# Patient Record
Sex: Male | Born: 1960 | Race: Black or African American | Hispanic: No | Marital: Married | Smoking: Never smoker
Health system: Southern US, Community
[De-identification: ages and names within clinical notes are randomized; demographics above are authoritative.]

## PROBLEM LIST (undated history)

## (undated) DIAGNOSIS — J45909 Unspecified asthma, uncomplicated: Secondary | ICD-10-CM

## (undated) DIAGNOSIS — I7090 Unspecified atherosclerosis: Secondary | ICD-10-CM

## (undated) DIAGNOSIS — M1990 Unspecified osteoarthritis, unspecified site: Secondary | ICD-10-CM

## (undated) HISTORY — PX: CORONARY ANGIOPLASTY WITH STENT PLACEMENT: SHX49

---

## 1997-11-24 ENCOUNTER — Other Ambulatory Visit: Admission: RE | Admit: 1997-11-24 | Discharge: 1997-11-24 | Payer: Self-pay | Admitting: Internal Medicine

## 2001-10-24 ENCOUNTER — Emergency Department (HOSPITAL_COMMUNITY): Admission: EM | Admit: 2001-10-24 | Discharge: 2001-10-24 | Payer: Self-pay | Admitting: Emergency Medicine

## 2016-02-02 ENCOUNTER — Telehealth (HOSPITAL_COMMUNITY): Payer: Self-pay | Admitting: *Deleted

## 2016-02-02 NOTE — Telephone Encounter (Signed)
Received message from [facility] cardiac rehab program.  [place] [facility].  Pt was contacted and since pt lives in [place] pt preferred to attend at [facility].  [facility] had received referral from [facility] and preparations were being made to obtain the necessary signed forms from MD.  Jeanene Erballed and spoke to pt, pt desires a later class or weekend.  Advised pt that the last phase II class is 2:45 to 4:00.  Pt unable to attend due to his work schedule.  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 I called [facility] to see their class time.  Pt remarked his school he teaches at is 2 miles away from the hospital.  Called and obtained the last class is 11-12:30.  They do not have an early morning class.  Pt called and message left for pt.  Asked for call back - contact information provided. Alanson Alyarlette Carlton RN, BSN

## 2016-06-16 ENCOUNTER — Emergency Department (HOSPITAL_COMMUNITY): Payer: BC Managed Care – PPO

## 2016-06-16 ENCOUNTER — Emergency Department (HOSPITAL_COMMUNITY)
Admission: EM | Admit: 2016-06-16 | Discharge: 2016-06-16 | Discharge status: Against Medical Advice | Payer: BC Managed Care – PPO | Attending: Emergency Medicine | Admitting: Emergency Medicine

## 2016-06-16 ENCOUNTER — Encounter (HOSPITAL_COMMUNITY): Payer: Self-pay | Admitting: Emergency Medicine

## 2016-06-16 DIAGNOSIS — Z79899 Other long term (current) drug therapy: Secondary | ICD-10-CM | POA: Insufficient documentation

## 2016-06-16 DIAGNOSIS — Z7982 Long term (current) use of aspirin: Secondary | ICD-10-CM | POA: Insufficient documentation

## 2016-06-16 DIAGNOSIS — Z955 Presence of coronary angioplasty implant and graft: Secondary | ICD-10-CM | POA: Diagnosis not present

## 2016-06-16 DIAGNOSIS — R079 Chest pain, unspecified: Secondary | ICD-10-CM

## 2016-06-16 DIAGNOSIS — J45909 Unspecified asthma, uncomplicated: Secondary | ICD-10-CM | POA: Insufficient documentation

## 2016-06-16 DIAGNOSIS — R072 Precordial pain: Secondary | ICD-10-CM | POA: Diagnosis present

## 2016-06-16 DIAGNOSIS — R0789 Other chest pain: Secondary | ICD-10-CM | POA: Insufficient documentation

## 2016-06-16 HISTORY — DX: Unspecified asthma, uncomplicated: J45.909

## 2016-06-16 HISTORY — DX: Unspecified osteoarthritis, unspecified site: M19.90

## 2016-06-16 HISTORY — DX: Unspecified atherosclerosis: I70.90

## 2016-06-16 LAB — CBC
HCT: 37.1 % — ABNORMAL LOW (ref 39.0–52.0)
Hemoglobin: 12.5 g/dL — ABNORMAL LOW (ref 13.0–17.0)
MCH: 26.9 pg (ref 26.0–34.0)
MCHC: 33.7 g/dL (ref 30.0–36.0)
MCV: 79.8 fL (ref 78.0–100.0)
PLATELETS: 214 10*3/uL (ref 150–400)
RBC: 4.65 MIL/uL (ref 4.22–5.81)
RDW: 16.7 % — ABNORMAL HIGH (ref 11.5–15.5)
WBC: 9.9 10*3/uL (ref 4.0–10.5)

## 2016-06-16 LAB — I-STAT TROPONIN, ED
TROPONIN I, POC: 0 ng/mL (ref 0.00–0.08)
Troponin i, poc: 0.01 ng/mL (ref 0.00–0.08)

## 2016-06-16 LAB — BASIC METABOLIC PANEL WITH GFR
Anion gap: 8 (ref 5–15)
BUN: 20 mg/dL (ref 6–20)
CO2: 24 mmol/L (ref 22–32)
Calcium: 8.7 mg/dL — ABNORMAL LOW (ref 8.9–10.3)
Chloride: 105 mmol/L (ref 101–111)
Creatinine, Ser: 1.02 mg/dL (ref 0.61–1.24)
GFR calc Af Amer: >60 mL/min (ref >60)
GFR calc non Af Amer: >60 mL/min (ref >60)
Glucose, Bld: 90 mg/dL (ref 65–99)
Potassium: 3.8 mmol/L (ref 3.5–5.1)
Sodium: 137 mmol/L (ref 135–145)

## 2016-06-16 NOTE — ED Triage Notes (Signed)
Pt c/o sharp left sided chest pain onset 30 minutes ago, lasted x 5 minutes. Took 1 nitroglycerin tablet, which relieved pain. Hx cardiac stent placement, said this was done because his "troponin was high." No n/v, other pain.

## 2016-06-16 NOTE — ED Notes (Signed)
Pt being transported by DG at present time.

## 2016-06-16 NOTE — ED Provider Notes (Signed)
Care assumed from previous provider PAGibbons. Please see note for further details. Briefly, patient presented to ED for chest pain. History of an NSTEMI back in August. Follow-up troponin with likely admission. EKG and labs reviewed by me. Chart reviewed as well. Second troponin negative, however given high risk, feel admission is necessary for concerns of cardiac etiology.   I went to evaluate patient and inform him of plan for admission. Patient states that he does not want to stay and is requesting discharge to home. I have discussed my concerns as a provider and the possibility that this may worsen. We discussed the nature, risks and benefits, and alternatives to treatment. I have specifically discussed that without further evaluation I cannot guarantee there is not a life threatening event occuring. Patient states that his pain improved with nitroglycerin earlier today. I specifically discussed that given his history of chest pain and pain improved with nitroglycerin, it is very possible that his pain could be of cardiac etiology. Time was given to allow the opportunity to ask questions and consider the options, and after the discussion, the patient decided to refuse admission. Pt is A&Ox4, his own POA and states understanding of my concerns and the possible consequences. After refusal, I made every reasonable opportunity to treat them to the best of my ability. I have made the patient aware that this is an AMA discharge, but he may return at any time for further evaluation and treatment. Encouraged patient to return to the ER immediately for any signs of chest pain or shortness of breath. Also strongly encouraged patient to follow up with his primary care provider and get established with cardiology. All questions answered.    [facility] Pil[facility] Karey Suthers, PA-C 06/16/16 2316    Shon Batonourtney F Horton, MD 06/17/16 [phone]

## 2016-06-16 NOTE — ED Provider Notes (Signed)
[facility]-EMERGENCY DEPT Provider Note   CSN: [id number] Arrival date & time: 06/16/16  1744     History   Chief Complaint Chief Complaint  Patient presents with  . Chest Pain    HPI Mathew Gonzales is a 56 y.o. male with pertinent past medical history of NSTEMI s/p left heart cath with DES placement (EF 55% on echo 01/12/2016), obesity, asthma presents to the emergency department with two episodes of intermittent, sharp sub-sternal chest pain PTA. Patient states that he was driving his vehicle earlier this afternoon when he developed sharp, central chest pain, patient took one nitroglycerin which took the pain away. Patient was driving himself to the emergency department when he developed a second episode of sharp, central chest pain, patient was given nitroglycerin by ED staff which again took the pain away. Patient denies nausea, shortness of breath, diaphoresis, blurred vision, lightheadedness or dizziness with the chest pain. Patient states he has been having similar chest pain occasionally since his left heart cath on August 2017, nitroglycerin usually relieves the pain. Patient states that the chest pain today was much more intense than the normal chest pain he has been having which scared him and caused him to come to the emergency department.  HPI  Past Medical History:  Diagnosis Date  . Arthritis   . Asthma   . Atherosclerosis     There are no active problems to display for this patient.   Past Surgical History:  Procedure Laterality Date  . CORONARY ANGIOPLASTY WITH STENT PLACEMENT         Home Medications    Prior to Admission medications   Medication Sig Start Date End Date Taking? Authorizing Provider  aspirin EC 81 MG tablet Take 81 mg by mouth daily.   Yes Historical Provider, MD  atorvastatin (LIPITOR) 80 MG tablet Take 80 mg by mouth daily.   Yes Historical Provider, MD  metoprolol tartrate (LOPRESSOR) 25 MG tablet Take 12.5 mg by mouth 2 (two) times daily.    Yes Historical Provider, MD  nitroGLYCERIN (NITROSTAT) 0.4 MG SL tablet Place 0.4 mg under the tongue every 5 (five) minutes as needed for chest pain.   Yes Historical Provider, MD  ticagrelor (BRILINTA) 90 MG TABS tablet Take 90 mg by mouth 2 (two) times daily.   Yes Historical Provider, MD    Family History History reviewed. No pertinent family history.  Social History Social History  Substance Use Topics  . Smoking status: Never Smoker  . Smokeless tobacco: Never Used  . Alcohol use No     Allergies   Benadryl [diphenhydramine]   Review of Systems Review of Systems  Constitutional: Negative for chills and fever.  HENT: Negative for congestion and sore throat.   Eyes: Negative for visual disturbance.  Respiratory: Negative for cough, choking and shortness of breath.   Cardiovascular: Positive for chest pain. Negative for palpitations.  Gastrointestinal: Negative for abdominal pain, blood in stool, constipation, diarrhea, nausea and vomiting.  Genitourinary: Negative for difficulty urinating, flank pain and hematuria.  Musculoskeletal: Negative for joint swelling and myalgias.  Skin: Negative for rash.  Neurological: Negative for dizziness, syncope, weakness, light-headedness and headaches.  Hematological: Negative.   Psychiatric/Behavioral: Negative.      Physical Exam Updated Vital Signs BP 123/73   Pulse 68   Temp 97.8 F (36.6 C) (Oral)   Resp 16   SpO2 98%   Physical Exam  Constitutional: He is oriented to person, place, and time. He appears well-developed and well-nourished. No  distress.  Obese male  HENT:  Head: Normocephalic and atraumatic.  Nose: Nose normal.  Mouth/Throat: Oropharynx is clear and moist. No oropharyngeal exudate.  Eyes: Conjunctivae and EOM are normal. Pupils are equal, round, and reactive to light.  Neck: Normal range of motion. Neck supple. No JVD present. No tracheal deviation present.  Cardiovascular: Normal rate, regular rhythm,  normal heart sounds and intact distal pulses.   No murmur heard. Pulmonary/Chest: Effort normal and breath sounds normal. No respiratory distress. He has no wheezes. He has no rales.  Abdominal: Soft. Bowel sounds are normal. He exhibits no distension. There is no tenderness.  Musculoskeletal: Normal range of motion. He exhibits no deformity.  Lymphadenopathy:    He has no cervical adenopathy.  Neurological: He is alert and oriented to person, place, and time.  Skin: Skin is warm and dry. Capillary refill takes less than 2 seconds.  Psychiatric: He has a normal mood and affect. His behavior is normal. Judgment and thought content normal.  Nursing note and vitals reviewed.    ED Treatments / Results  Labs (all labs ordered are listed, but only abnormal results are displayed) Labs Reviewed  BASIC METABOLIC PANEL - Abnormal; Notable for the following:       Result Value   Calcium 8.7 (*)    All other components within normal limits  CBC - Abnormal; Notable for the following:    Hemoglobin 12.5 (*)    HCT 37.1 (*)    RDW 16.7 (*)    All other components within normal limits  I-STAT TROPOININ, ED  I-STAT TROPOININ, ED    EKG  EKG Interpretation  Date/Time:  Saturday June 16 2016 17:48:36 EST Ventricular Rate:  76 PR Interval:    QRS Duration: 104 QT Interval:  391 QTC Calculation: 440 R Axis:   -42 Text Interpretation:  Sinus rhythm Ventricular premature complex Left axis deviation Low voltage, precordial leads Borderline T abnormalities, inferior leads No previous tracing Confirmed by [facility] MD, Barbara Cower [phone]) on 06/18/2016 11:11:26 PM       Radiology No results found.  Procedures Procedures (including critical care time)  Medications Ordered in ED Medications - No data to display   Initial Impression / Assessment and Plan / ED Course  I have reviewed the triage vital signs and the nursing notes.  Pertinent labs & imaging results that were available during my  care of the patient were reviewed by me and considered in my medical decision making (see chart for details).  Clinical Course as of Jun 20 1642  Sat Jun 16, 2016  2137 Normal Troponin i, poc: 0.00 [CG]  2137 No cardiopulmonary disease DG Chest 2 View [CG]  2137 Sinus rhythm, borderline T wave abnormalities EKG 12-Lead [CG]  2138 Normal BP: 125/70 [CG]  2138 Normal Pulse Rate: 68 [CG]  2138 Normal SpO2: 100 % [CG]    Clinical Course User Index [CG] Liberty Handy, PA-C   High risk patient will likely need admission. Troponin x 1 normal. EKG with borderline T wave abnormalities. No cardiopulmonary disease in CXR. Unremarkable CBC and BMP. Vitals signs have remained within normal limits and stable. Repeat troponin pending, will hand off to oncoming PA (Ward) who will follow up on troponin.  Given pt high risk patient will likely need to be admitted.   Final Clinical Impressions(s) / ED Diagnoses   Final diagnoses:  Chest pain with high risk for cardiac etiology    New Prescriptions Discharge Medication List as of 06/16/2016  11:12 PM       Liberty Handylaudia J Jihad Brownlow, PA-C 06/20/16 1644    Nelva Nayobert Beaton, MD 07/29/16 [phone]

## 2016-06-16 NOTE — Discharge Instructions (Signed)
Follow up with your primary care provider for discussion of today's visit.  If you have any chest pain, difficulty breathing, shoulder pain, jaw pain, abdominal pain, new or worsening symptoms or any additional concerns please return to the ER immediately.

## 2016-06-16 NOTE — ED Notes (Signed)
Pt complaint of left side of chest under breast described as stabbing onset 1745. Pt gave self nitroglycerin SL PTA and chest pain rated 7/10 at time of arrival. Pt then given 2nd nitroglycerin SL by ED staff in triage; pt currently denies pain.

## 2017-08-31 IMAGING — CR DG CHEST 2V
2 series · 2 of 2 positions shown · non-contrast
Comparison: None.

CLINICAL DATA: Pt c/o sudden onset central to left anterior chest
pain x 45 minutes ago, w/ weakness, dizziness. Reports hx one
cardiac stent placed x 5 months ago.

EXAM:
CHEST  2 VIEW

[w chest lat]
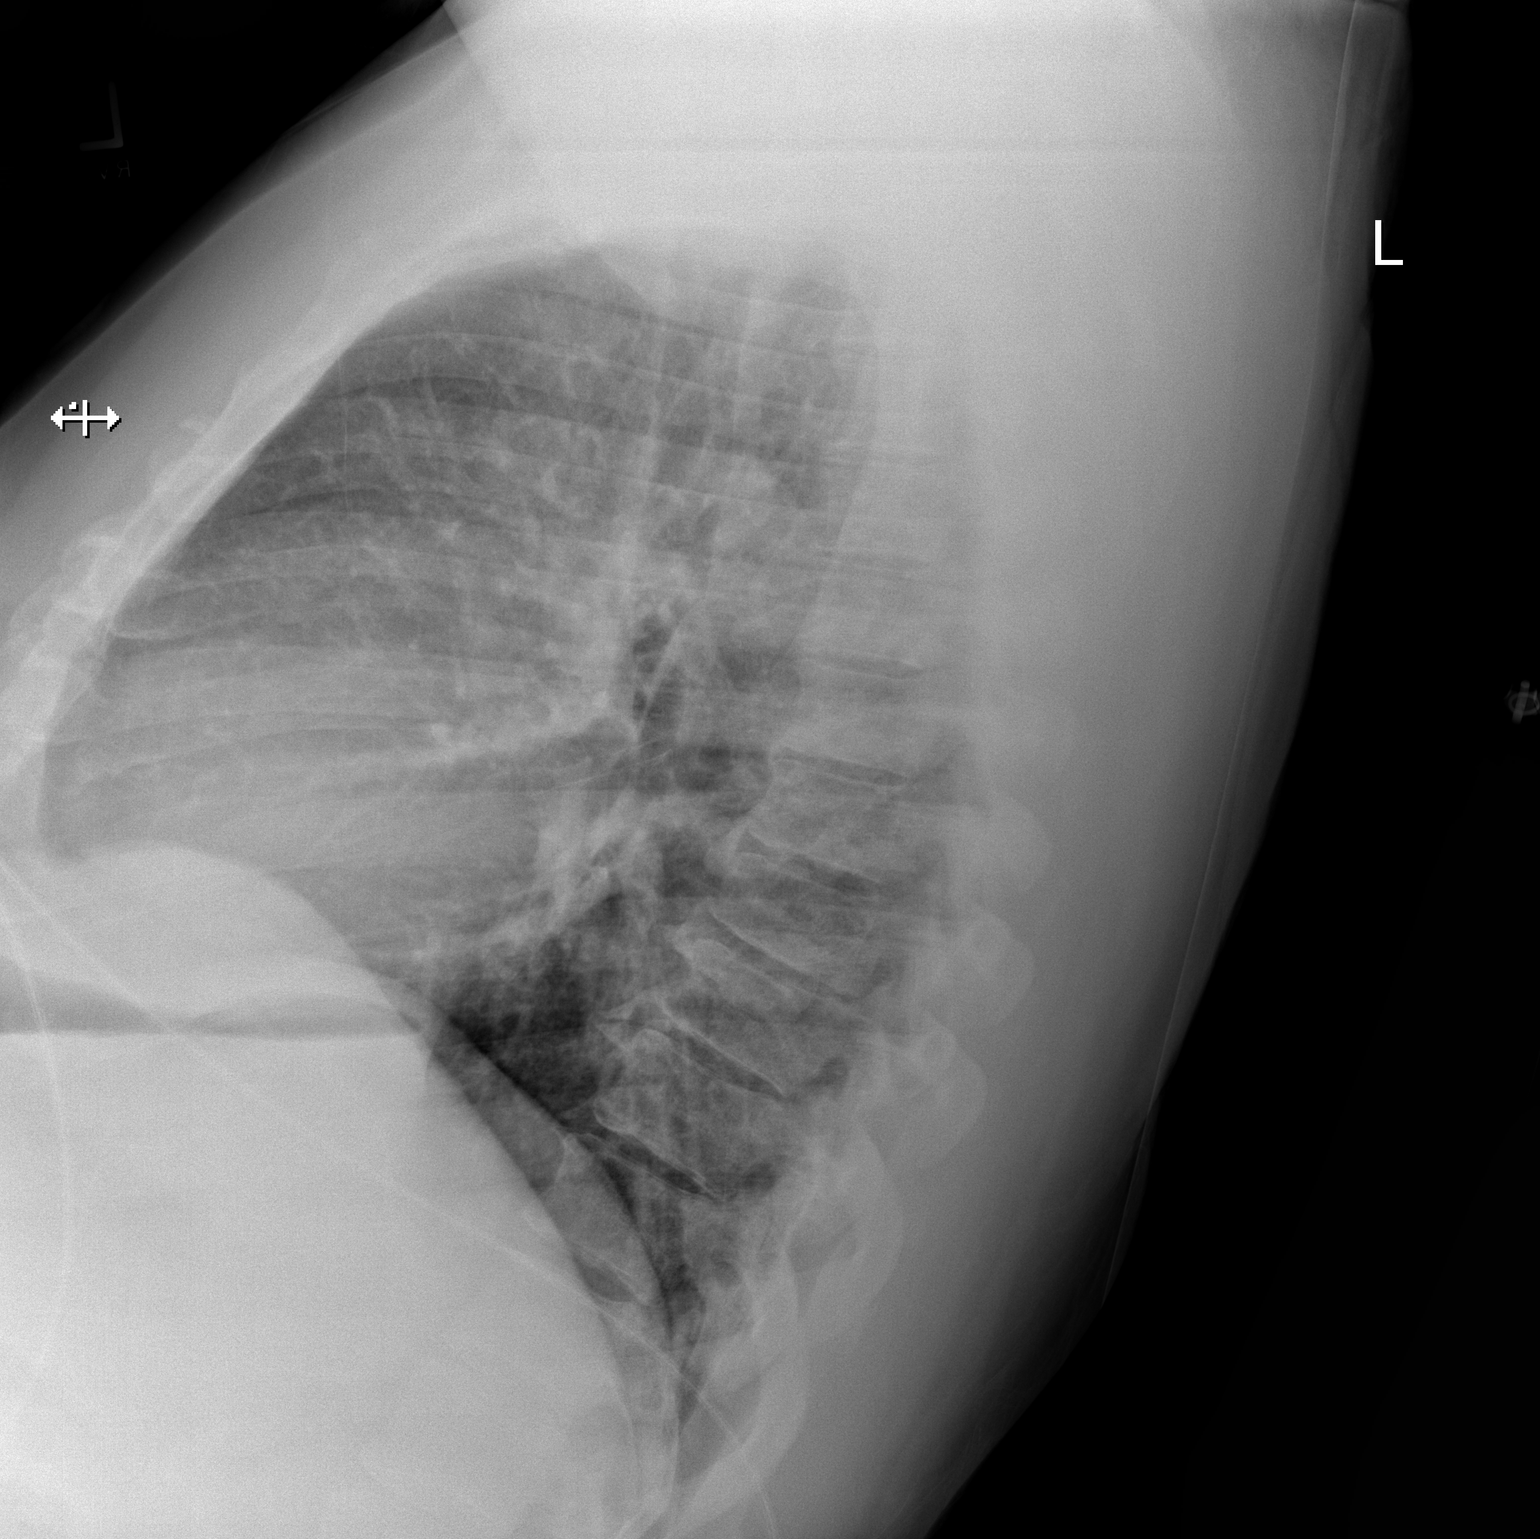

[x chest ap]
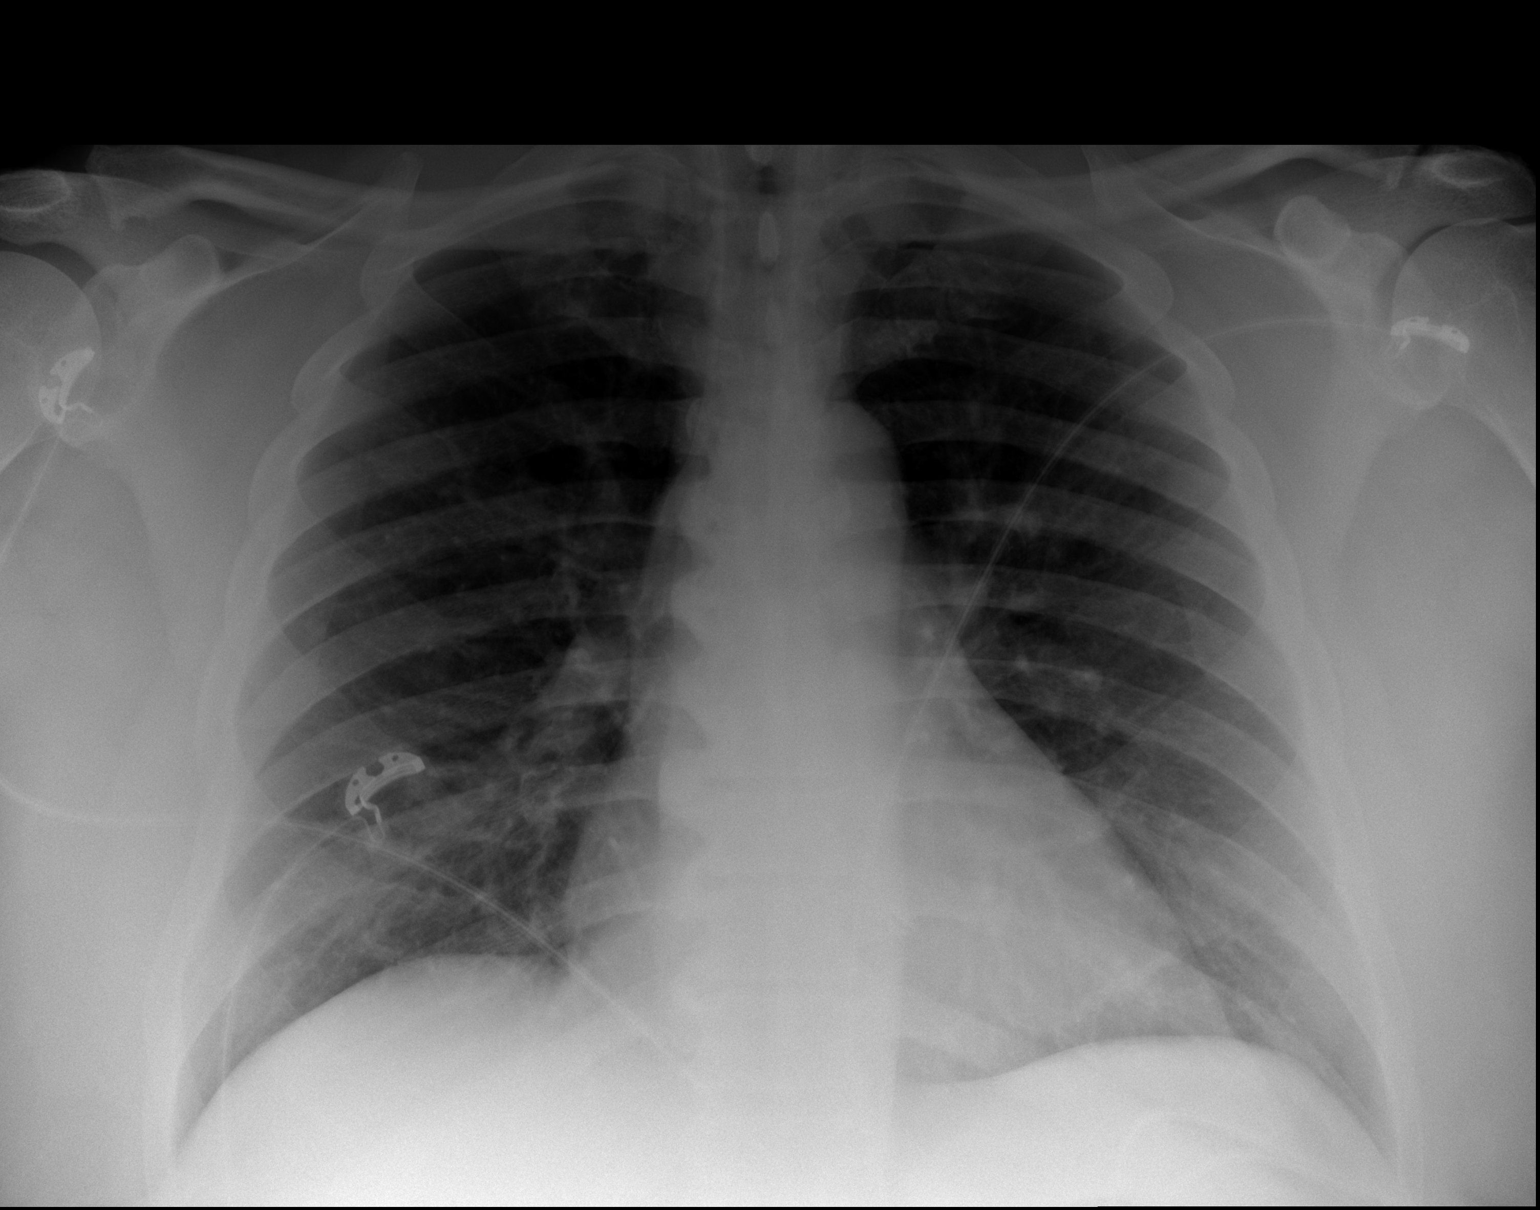

[2 of 2 positions shown; findings below may reference images not displayed]

FINDINGS: The heart size and mediastinal contours are within normal limits.
Both lungs are clear. No pleural effusion or pneumothorax. The
visualized skeletal structures are unremarkable.
IMPRESSION: No active cardiopulmonary disease.
